# Patient Record
Sex: Male | Born: 1975 | Race: White | Hispanic: No | Marital: Single | Smoking: Current every day smoker
Health system: Southern US, Community
[De-identification: ages and names within clinical notes are randomized; demographics above are authoritative.]

## PROBLEM LIST (undated history)

## (undated) HISTORY — PX: FACIAL RECONSTRUCTION SURGERY: SHX631

## (undated) HISTORY — PX: HAND SURGERY: SHX662

---

## 1999-09-21 ENCOUNTER — Emergency Department (HOSPITAL_COMMUNITY): Admission: EM | Admit: 1999-09-21 | Discharge: 1999-09-21 | Payer: Self-pay | Admitting: Internal Medicine

## 2009-08-18 ENCOUNTER — Emergency Department (HOSPITAL_COMMUNITY): Admission: EM | Admit: 2009-08-18 | Discharge: 2009-08-18 | Payer: Self-pay | Admitting: Emergency Medicine

## 2012-12-21 ENCOUNTER — Encounter (HOSPITAL_COMMUNITY): Payer: Self-pay | Admitting: Emergency Medicine

## 2012-12-21 ENCOUNTER — Emergency Department (HOSPITAL_COMMUNITY)
Admission: EM | Admit: 2012-12-21 | Discharge: 2012-12-21 | Disposition: A | Discharge status: Home / Self Care | Payer: Self-pay | Attending: Emergency Medicine | Admitting: Emergency Medicine

## 2012-12-21 DIAGNOSIS — K089 Disorder of teeth and supporting structures, unspecified: Secondary | ICD-10-CM | POA: Insufficient documentation

## 2012-12-21 DIAGNOSIS — R6883 Chills (without fever): Secondary | ICD-10-CM | POA: Insufficient documentation

## 2012-12-21 DIAGNOSIS — R609 Edema, unspecified: Secondary | ICD-10-CM | POA: Insufficient documentation

## 2012-12-21 DIAGNOSIS — Z792 Long term (current) use of antibiotics: Secondary | ICD-10-CM | POA: Insufficient documentation

## 2012-12-21 DIAGNOSIS — K0889 Other specified disorders of teeth and supporting structures: Secondary | ICD-10-CM

## 2012-12-21 DIAGNOSIS — F17200 Nicotine dependence, unspecified, uncomplicated: Secondary | ICD-10-CM | POA: Insufficient documentation

## 2012-12-21 MED ORDER — CLINDAMYCIN HCL 150 MG PO CAPS: 300.0000 mg | ORAL_CAPSULE | Freq: Three times a day (TID) | ORAL | Status: DC

## 2012-12-21 MED ORDER — HYDROCODONE-ACETAMINOPHEN 5-325 MG PO TABS: 2.0000 | ORAL_TABLET | Freq: Four times a day (QID) | ORAL | Status: DC | PRN

## 2012-12-21 NOTE — ED Provider Notes (Signed)
History  This chart was scribed for Russell Castro - PA by Manuela Schwartz, ED scribe. This patient was seen in room TR06C/TR06C and the patient's care was started at 1559.  CSN: [id number] Arrival date & time 12/21/12  1544  First MD Initiated Contact with Patient 12/21/12 1559     Chief Complaint  Patient presents with  . Dental Pain   The history is provided by the patient. No language interpreter was used.   HPI Comments: Russell Castro is a 37 y.o. male who presents to the Emergency Department w/hx of dental carries complaining of constant, gradually worsening left upper dental pain onset 2 days ago which he attributes to a dental abscess which he noticed worsened 2 days ago. He states past couple weeks he noticed it but past couple days tried taking a clindamycin he found at home but only took a few doses and did not feel any improvements. He also states tried taking tylenol and ASA w/out improvements in swelling or pain. He states associated symptoms of chills/swelling/pain and difficulty chewing food.    History reviewed. No pertinent past medical history. Past Surgical History  Procedure Laterality Date  . Facial reconstruction surgery    . Hand surgery     History reviewed. No pertinent family history. History  Substance Use Topics  . Smoking status: Current Every Day Smoker    Types: Cigarettes  . Smokeless tobacco: Not on file  . Alcohol Use: Yes    Review of Systems  Constitutional: Positive for chills. Negative for fever.  HENT: Positive for dental problem (left upper dental pain/swelling). Negative for congestion and rhinorrhea.   Respiratory: Negative for cough and shortness of breath.   Cardiovascular: Negative for chest pain.  Gastrointestinal: Negative for nausea, vomiting and abdominal pain.  Musculoskeletal: Negative for back pain.  Skin: Negative for color change and pallor.  Neurological: Negative for weakness.  All other systems reviewed and are  negative.   A complete 10 system review of systems was obtained and all systems are negative except as noted in the HPI and PMH.   Allergies  Sulfa antibiotics  Home Medications   Current Outpatient Rx  Name  Route  Sig  Dispense  Refill  . acetaminophen (TYLENOL) 500 MG tablet   Oral   Take 500 mg by mouth every 6 (six) hours as needed for pain.         . CEPHALEXIN PO   Oral   Take 1 tablet by mouth daily.         Marland Kitchen CLINDAMYCIN HCL PO   Oral   Take 2 tablets by mouth 3 (three) times daily.         Marland Kitchen ibuprofen (ADVIL,MOTRIN) 200 MG tablet   Oral   Take 200 mg by mouth every 6 (six) hours as needed for pain.          Triage vitals: BP 116/71  Pulse 74  Temp(Src) 97.6 F (36.4 C) (Oral)  Resp 16  Ht 6\' 4"  (1.93 m)  Wt 140 lb (63.504 kg)  BMI 17.05 kg/m2  SpO2 99% Physical Exam  Nursing note and vitals reviewed. Constitutional: He is oriented to person, place, and time. He appears well-developed and well-nourished. No distress.  HENT:  Head: Normocephalic and atraumatic.  Mouth/Throat:    Poor dentition throughout.  Affected tooth as diagrammed.  No signs of peritonsillar or tonsillar abscess.  No signs of gingival abscess. Oropharynx is clear and without exudates.  Uvula is midline.  Airway is intact. No signs of Ludwig's angina.   Eyes: EOM are normal.  Neck: Neck supple. No tracheal deviation present.  Cardiovascular: Normal rate.   Pulmonary/Chest: Effort normal. No respiratory distress.  Musculoskeletal: Normal range of motion.  Neurological: He is alert and oriented to person, place, and time.  Skin: Skin is warm and dry.  Psychiatric: He has a normal mood and affect. His behavior is normal.    ED Course  Procedures (including critical care time) DIAGNOSTIC STUDIES: Oxygen Saturation is 99% on room air, normal by my interpretation.    COORDINATION OF CARE: At 408 PM Discussed treatment plan with patient which includes antibiotics, pain  medicine. Patient agrees.   Labs Reviewed - No data to display No results found. 1. Pain, dental     MDM  Uncomplicated dental pain.  Abx, pain meds, dental referral.  VSS. Afebrile.    I personally performed the services described in this documentation, which was scribed in my presence. The recorded information has been reviewed and is accurate.   Russell Horseman, PA-C 12/21/12 1618  Russell Horseman, PA-C 12/21/12 [phone]

## 2012-12-21 NOTE — ED Provider Notes (Signed)
Medical screening examination/treatment/procedure(s) were performed by non-physician practitioner and as supervising physician I was immediately available for consultation/collaboration.  Doug Sou, MD 12/21/12 [phone]

## 2012-12-21 NOTE — ED Notes (Addendum)
Pt presents to ED today with complaints of possible mouth swelling and pain since yesterday at 9am. Left side of face noted to be swollen in triage.

## 2013-06-16 ENCOUNTER — Emergency Department (HOSPITAL_COMMUNITY): Payer: No Typology Code available for payment source

## 2013-06-16 ENCOUNTER — Emergency Department (HOSPITAL_COMMUNITY)
Admission: EM | Admit: 2013-06-16 | Discharge: 2013-06-16 | Disposition: A | Discharge status: Home / Self Care | Payer: No Typology Code available for payment source | Attending: Emergency Medicine | Admitting: Emergency Medicine

## 2013-06-16 ENCOUNTER — Encounter (HOSPITAL_COMMUNITY): Payer: Self-pay | Admitting: Emergency Medicine

## 2013-06-16 DIAGNOSIS — S0083XA Contusion of other part of head, initial encounter: Secondary | ICD-10-CM

## 2013-06-16 DIAGNOSIS — Z792 Long term (current) use of antibiotics: Secondary | ICD-10-CM | POA: Insufficient documentation

## 2013-06-16 DIAGNOSIS — S298XXA Other specified injuries of thorax, initial encounter: Secondary | ICD-10-CM | POA: Insufficient documentation

## 2013-06-16 DIAGNOSIS — Y9389 Activity, other specified: Secondary | ICD-10-CM | POA: Insufficient documentation

## 2013-06-16 DIAGNOSIS — Y92410 Unspecified street and highway as the place of occurrence of the external cause: Secondary | ICD-10-CM | POA: Insufficient documentation

## 2013-06-16 DIAGNOSIS — S0003XA Contusion of scalp, initial encounter: Secondary | ICD-10-CM | POA: Insufficient documentation

## 2013-06-16 DIAGNOSIS — F17200 Nicotine dependence, unspecified, uncomplicated: Secondary | ICD-10-CM | POA: Insufficient documentation

## 2013-06-16 DIAGNOSIS — R0789 Other chest pain: Secondary | ICD-10-CM

## 2013-06-16 DIAGNOSIS — S1093XA Contusion of unspecified part of neck, initial encounter: Secondary | ICD-10-CM

## 2013-06-16 DIAGNOSIS — IMO0002 Reserved for concepts with insufficient information to code with codable children: Secondary | ICD-10-CM | POA: Insufficient documentation

## 2013-06-16 DIAGNOSIS — S060X1A Concussion with loss of consciousness of 30 minutes or less, initial encounter: Secondary | ICD-10-CM

## 2013-06-16 MED ORDER — ACETAMINOPHEN 325 MG PO TABS
650.0000 mg | ORAL_TABLET | Freq: Once | ORAL | Status: AC
  Administered 2013-06-16: 650 mg via ORAL
  Filled 2013-06-16: qty 2
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
## 2013-06-16 NOTE — Discharge Instructions (Signed)
You have had a head injury which does not appear to require admission at this time. A concussion is a state of changed mental ability from trauma. SEEK IMMEDIATE MEDICAL ATTENTION IF: There is confusion or drowsiness (although children frequently become drowsy after injury).  You cannot awaken the injured person.  There is nausea (feeling sick to your stomach) or continued, forceful vomiting.  You notice dizziness or unsteadiness which is getting worse, or inability to walk.  You have convulsions or unconsciousness.  You experience severe, persistent headaches not relieved by Tylenol?. (Do not take aspirin as this impairs clotting abilities). Take other pain medications only as directed.  You cannot use arms or legs normally.  There are changes in pupil sizes. (This is the black center in the colored part of the eye)  There is clear or bloody discharge from the nose or ears.  Change in speech, vision, swallowing, or understanding.  Localized weakness, numbness, tingling, or change in bowel or bladder control. You have been diagnosed by your caregiver as having chest wall pain. SEEK IMMEDIATE MEDICAL ATTENTION IF: You develop a fever.  Your chest pains become severe or intolerable.  You develop new, unexplained symptoms (problems).  You develop shortness of breath, nausea, vomiting, sweating or feel light headed.  You develop a new cough or you cough up blood.  Emergency Department Resource Guide 1) Find a Doctor and Pay Out of Pocket Although you won't have to find out who is covered by your insurance plan, it is a good idea to ask around and get recommendations. You will then need to call the office and see if the doctor you have chosen will accept you as a new patient and what types of options they offer for patients who are self-pay. Some doctors offer discounts or will set up payment plans for their patients who do not have insurance, but you will need to ask so you aren't surprised when you  get to your appointment.  2) Contact Your Local Health Department Not all health departments have doctors that can see patients for sick visits, but many do, so it is worth a call to see if yours does. If you don't know where your local health department is, you can check in your phone book. The CDC also has a tool to help you locate your state's health department, and many state websites also have listings of all of their local health departments.  3) Find a Walk-in Clinic If your illness is not likely to be very severe or complicated, you may want to try a walk in clinic. These are popping up all over the country in pharmacies, drugstores, and shopping centers. They're usually staffed by nurse practitioners or physician assistants that have been trained to treat common illnesses and complaints. They're usually fairly quick and inexpensive. However, if you have serious medical issues or chronic medical problems, these are probably not your best option.  No Primary Care Doctor: - Call Health Connect at  [phone] - they can help you locate a primary care doctor that  accepts your insurance, provides certain services, etc. - Physician Referral Service- [phone]  Chronic Pain Problems: Organization         Address  Phone   Notes  [facility]  [phone] Patients need to be referred by their primary care doctor.   Medication Assistance: Organization         Address  Phone   Notes  [facility] Medication Assistance Program  [address] [place], [place] [address] [phone]) [phone] --Must be a resident of [facility] [place] -- Must have NO insurance coverage whatsoever (no Medicaid/ Medicare, etc.) -- The pt. MUST have a primary care doctor that directs their care regularly and follows them in the community   MedAssist  [phone]) [phone]   [employer]  [phone]    Agencies that provide inexpensive medical care: Organization         Address  Phone    Notes  [facility]  [phone]   [facility]    [phone]   [facility] [address] [place], [place] [address] [phone]) [phone]   [facility] [address], [place] [phone]) [phone]   [facility]anned Parenthood    [phone]) [phone]   [facility]    [phone]) [phone]   [facility]  [address], [place] Phone:  [phone]) [phone], Fax:  [phone] Hours of Operation:  9 am - 6 pm, M-F.  Also accepts Medicaid/Medicare and self-pay.  [facility]  [address], [place] Phone: [phone](336) [phone], Fax: [phone]. Hours of Operation:  8:30 am - 5:30 pm, M-F.  Also accepts Medicaid and self-pay.  [facility] [place] [address], [place] Phone: [phone]) [phone]   [facility] [address], [place], [place] [phone], Ext. [phone] Mondays & Thursdays: 7-9 AM.  First 15 patients are seen on a first come, first serve basis.    Medicaid-accepting [facility] Providers:  Organization         Address  Phone   Notes  [facility] [address], [place] [phone] Also accepts self-pay patients.  [facility] [address], [place]  [phone]) [phone]   [facility] [address], [place] [phone]) [phone]   [facility] [address] [place] [address], [place] [phone]   Renaye RakersVeita Bland [address], [place]   [phone]) [phone] Only accepts [place] Access [place] patients after they have their name applied to their card.   Self-Pay (no insurance) in [facility] [place]:  Organization         Address  Phone   Notes  Sickle Cell Patients, [facility] [address], [place] [phone]   [facility] [address], [place] [phone]) [phone]   [facility] [place]  [address],  [phone]) [phone]   [facility]/Dr. Osei-Bonsu  [address] [place] [address], [place] or [phone], [place] [phone]) [phone] Phone number for both [place] and [place] locations is the same.  [facility] [address], [place] [phone]   [facility] [place] [address] [place] [address], [place] or [address] [phone](336) [phone]) [phone]   [facility] [address], [place] [phone]) [phone], phone; [phone]) [phone], fax Sees patients 1st and 3rd Saturday of every month.  Must not qualify for public or private insurance (i.e. Medicaid, Medicare, [place] Health Choice, Veterans' Benefits)  Household income should be no more than 200% of the poverty level The clinic cannot treat you if you are pregnant or think you are pregnant  Sexually transmitted diseases are not treated at the clinic.  Dental Care: Organization         Address  Phone  Notes  [facility] [address] [place], [place] [phone] Accepts children up to age 58 who are enrolled in [place] or [place] Health Choice; pregnant women with a Medicaid card; and children who have applied for Medicaid or [place] Health Choice, but were declined, whose parents can pay a reduced fee at time of service.  [facility] of [facility]  [address], [place] [phone] Accepts children up to age 30 who are enrolled in [place] or [place] Health Choice; pregnant women with a Medicaid card; and children who have applied for Medicaid or [place] Health Choice, but were declined, whose parents can pay a reduced fee at time of service.  [facility]  [address] [place], [place] [phone] Patients are seen by appointment only. Walk-ins are not accepted. [facility] will see patients 62 years of age and  older. Monday - Tuesday (8am-5pm) Most Wednesdays (8:30-5pm) $30 per visit, cash only  [facility] Access PRO[facility]  [address], [facility] [phone] Patients are seen by appointment only. Walk-ins are not accepted. [facility] will see patients 35 years of age and older. One Wednesday Evening (Monthly: Volunteer Based).  $30 per visit, cash only  [employer]  [phone] for adults; Children under age 53, call Graduate Pediatric Dentistry at [phone]. Children aged 40-14, please call [phone] to request a pediatric application.  Dental services are provided in all areas of dental care including fillings, crowns and bridges, complete and partial dentures, implants, gum treatment, root canals, and extractions. Preventive care is also provided. Treatment is provided to both adults and children. Patients are selected via a lottery and there is often a waiting list.   [facility] [address] [place]  [phone] [email or web address]   [facility] [address] [place], [place] [phone], Ext. [phone] Second and Fourth Thursday of each month, opens at 6:30 AM; Clinic ends at 9 AM.  Patients are seen on a first-come first-served basis, and a limited number are seen during each clinic.   [facility]  [address] Ether [place], [place] [phone]   Eligibility Requirements You must have lived in [place], [place], or [place] counties for at least the last three months.   You cannot be eligible for state or federal sponsored [place], including [employer], [place], or [employer].   You generally cannot be eligible for healthcare insurance through your employer.    How to apply: Eligibility screenings are held every Tuesday and Wednesday afternoon from 1:00 pm until 4:00 pm. You do not need an appointment for the interview!  [facility] [address],  [place], [place] [phone]   [facility] Health Department  [phone]   [facility] Health Department  [phone]   [facility] Health Department  [phone]    Behavioral Health Resources in the Community: Intensive Outpatient Programs Organization         Address  Phone  Notes  [facility] [address] [place], [place] [phone]   [facility]patient [address] [place], [place] [phone]   [facility] [address], [place], [place]  [phone]   [facility] [address],  [place], [place] [phone] or [phone]   Substance Abuse Resources Organization         Address  Phone  Notes  [facility] Drug Services  [phone]   [facility]  [phone]   [facility] [place]  [phone]   Floydene Flock  [phone]   Residential & Outpatient Substance Abuse Program  [phone]   Psychological Services Organization         Address  Phone  Notes  [facility]  [phone]   [facility]  [phone]   [facility] [address], [place] [phone] or [phone]    Mobile Crisis Teams Organization         Address  Phone  Notes  [facility], Mobile Crisis Care Unit  [phone]   [facility]  [address] [place], [place] [phone]   Doristine Locks [address] [place] [phone]    Self-Help/Support Groups Organization         Address  Phone             Notes  Mental Health Assoc. of  - variety of support groups  336- I7437963 Call for more information  Narcotics Anonymous (NA), [facility] [address], [employer] [place]  2 meetings at this location   Statistician         Address  Phone  Notes  [facility] [address],    [place]  [phone]   [facility]  [address] [place] [address], [place], [place] [phone]   [facility] [address] [place], [place] [phone] Admissions: 8am-3pm M-F  [facility] [address],    [place], [place] [phone]   [facility] [address] [place], [place], [place] [phone]   [facility] [address]  [place], [place] [phone]   [facility] - Intensive Outpatient [address], [place], [place] [phone]   [facility] ([facility] [address]  [place], [place] [phone] or [phone]   [facility] ([facility]) [address], [place], [place] [phone] Accepts Medicaid  [facility] [address],  [place] [phone] Substance Abuse/Addiction Treatment   [facility] Organization         Address  Phone  Notes  [facility]  [phone]   Angie Fava, PhD [address] [place], [place]   [phone] or [phone]   [facility] Behavioral   [address] [place], [place] [phone]   [facility] [address], [place], [place] [phone] Insurance/Medicaid/sponsorship through [facility] [address]                                    [place], [place] [phone] Therapy/tele-psych/case  [facility] [address], [place] [phone]    Dr. Lolly Mustache  [phone]   [facility]  [employer] [facility] Dept. 1) [address] [place] 2) [address], [place] 3)  [address], [place] [phone]  [phone]   [facility] Child Abuse Hotline (602)384-4078 or [phone] (After Hours)

## 2013-06-16 NOTE — ED Notes (Addendum)
While in CT, pt became anxious, trying to take c-collar off. Was spinning it around on his neck. This RN went over to CT and reassured him everything was okay and educated him on the importance of keeping the c-collar in place. Pt verbalized understanding and compliance. Is asking for something for a headache.

## 2013-06-16 NOTE — ED Notes (Signed)
[employer] officer at bedside talking with patient.
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
## 2015-01-13 ENCOUNTER — Emergency Department
Admission: EM | Admit: 2015-01-13 | Discharge: 2015-01-13 | Disposition: A | Discharge status: Home / Self Care | Payer: Self-pay | Attending: Emergency Medicine | Admitting: Emergency Medicine

## 2015-01-13 ENCOUNTER — Emergency Department: Payer: Self-pay

## 2015-01-13 DIAGNOSIS — Z720 Tobacco use: Secondary | ICD-10-CM | POA: Insufficient documentation

## 2015-01-13 DIAGNOSIS — S0093XA Contusion of unspecified part of head, initial encounter: Secondary | ICD-10-CM

## 2015-01-13 DIAGNOSIS — W06XXXA Fall from bed, initial encounter: Secondary | ICD-10-CM | POA: Insufficient documentation

## 2015-01-13 DIAGNOSIS — S0083XA Contusion of other part of head, initial encounter: Secondary | ICD-10-CM | POA: Insufficient documentation

## 2015-01-13 DIAGNOSIS — Y998 Other external cause status: Secondary | ICD-10-CM | POA: Insufficient documentation

## 2015-01-13 DIAGNOSIS — Y9389 Activity, other specified: Secondary | ICD-10-CM | POA: Insufficient documentation

## 2015-01-13 DIAGNOSIS — Y9289 Other specified places as the place of occurrence of the external cause: Secondary | ICD-10-CM | POA: Insufficient documentation

## 2015-01-13 DIAGNOSIS — S199XXA Unspecified injury of neck, initial encounter: Secondary | ICD-10-CM | POA: Insufficient documentation

## 2015-01-13 MED ORDER — IBUPROFEN 800 MG PO TABS: 800.0000 mg | ORAL_TABLET | Freq: Three times a day (TID) | ORAL | Status: DC | PRN

## 2015-01-13 MED ORDER — KETOROLAC TROMETHAMINE 60 MG/2ML IM SOLN
60.0000 mg | Freq: Once | INTRAMUSCULAR | Status: AC
  Administered 2015-01-13: 60 mg via INTRAMUSCULAR
  Filled 2015-01-13: qty 2

## 2015-01-13 MED ORDER — CYCLOBENZAPRINE HCL 10 MG PO TABS: 10.0000 mg | ORAL_TABLET | Freq: Three times a day (TID) | ORAL | Status: DC | PRN

## 2015-01-13 MED ORDER — HYDROCODONE-ACETAMINOPHEN 5-325 MG PO TABS: 1.0000 | ORAL_TABLET | ORAL | Status: DC | PRN

## 2015-01-13 MED ORDER — DIAZEPAM 5 MG/ML IJ SOLN
5.0000 mg | Freq: Once | INTRAMUSCULAR | Status: AC
  Administered 2015-01-13: 5 mg via INTRAMUSCULAR
  Filled 2015-01-13: qty 2

## 2015-01-13 NOTE — ED Notes (Signed)
Pt states he rolled out of bed today and is having neck and back pain

## 2015-01-13 NOTE — ED Provider Notes (Signed)
[facility] Emergency Department Provider Note  ____________________________________________  Time seen: Approximately 5:54 PM  I have reviewed the triage vital signs and the nursing notes.   HISTORY  Chief Complaint Fall   HPI JP EASTHAM is a 39 y.o. male reports falling out of bed at about 3:00 this morning. She states that is about 4 feet tall his head on a dresser causing an abrasion with loss of consciousness for about a minute to 2 minutes.States that his neck is been hurting all day along with body aches. Denies any nausea or vomiting.   History reviewed. No pertinent past medical history.  There are no active problems to display for this patient.   Past Surgical History  Procedure Laterality Date  . Facial reconstruction surgery    . Hand surgery      Current Outpatient Rx  Name  Route  Sig  Dispense  Refill  . cyclobenzaprine (FLEXERIL) 10 MG tablet   Oral   Take 1 tablet (10 mg total) by mouth every 8 (eight) hours as needed for muscle spasms.   30 tablet   1   . HYDROcodone-acetaminophen (NORCO) 5-325 MG per tablet   Oral   Take 1-2 tablets by mouth every 4 (four) hours as needed for moderate pain.   8 tablet   0   . ibuprofen (ADVIL,MOTRIN) 800 MG tablet   Oral   Take 1 tablet (800 mg total) by mouth every 8 (eight) hours as needed.   30 tablet   0     Allergies Sulfa antibiotics  No family history on file.  Social History History  Substance Use Topics  . Smoking status: Current Every Day Smoker    Types: Cigarettes  . Smokeless tobacco: Not on file  . Alcohol Use: Yes    Review of Systems Constitutional: No fever/chills Eyes: No visual changes. ENT: No sore throat. Cardiovascular: Denies chest pain. Respiratory: Denies shortness of breath. Gastrointestinal: No abdominal pain.  No nausea, no vomiting.  No diarrhea.  No constipation. Genitourinary: Negative for dysuria. Musculoskeletal: Positive for neck  pain for body aches. Skin: Negative for rash. Neurological: Negative for headaches, focal weakness or numbness.  10-point ROS otherwise negative.  ____________________________________________   PHYSICAL EXAM:  VITAL SIGNS: ED Triage Vitals  Enc Vitals Group     BP 01/13/15 1734 129/82 mmHg     Pulse Rate 01/13/15 1734 70     Resp 01/13/15 1734 16     Temp 01/13/15 1734 98 F (36.7 C)     Temp Source 01/13/15 1734 Oral     SpO2 01/13/15 1734 98 %     Weight 01/13/15 1734 160 lb (72.576 kg)     Height 01/13/15 1734  (1.93 m)     Head Cir --      Peak Flow --      Pain Score 01/13/15 1734 6     Pain Loc --      Pain Edu? --      Excl. in GC? --     Constitutional: Alert and oriented. Well appearing and in no acute distress. Eyes: Conjunctivae are normal. PERRL. EOMI. Head: Atraumatic. Nose: No congestion/rhinnorhea. Mouth/Throat: Mucous membranes are moist.  Oropharynx non-erythematous. Neck: No stridor.  Cervical tenderness. Limited range of motion with flexion, extension and lateralization Cardiovascular: Normal rate, regular rhythm. Grossly normal heart sounds.  Good peripheral circulation. Respiratory: Normal respiratory effort.  No retractions. Lungs CTAB. Musculoskeletal: No lower extremity tenderness nor edema.  No joint effusions. Neurologic:  Normal speech and language. No gross focal neurologic deficits are appreciated. No gait instability. Skin:  Skin is warm, dry and intact. No rash noted. Psychiatric: Mood and affect are normal. Speech and behavior are normal.  ____________________________________________   LABS (all labs ordered are listed, but only abnormal results are displayed)  Labs Reviewed - No data to display ____________________________________________   RADIOLOGY  Head CT and spinal CT both negative by radiologist left orbital screws intact from previous  surgery. ____________________________________________   PROCEDURES  Procedure(s) performed: None  Critical Care performed: No  ____________________________________________   INITIAL IMPRESSION / ASSESSMENT AND PLAN / ED COURSE  Pertinent labs & imaging results that were available during my care of the patient were reviewed by me and considered in my medical decision making (see chart for details).  Acute facial and head contusion. Rx given for Motrin and Flexeril. Patient follow-up with PCP or return to the ER with any worsening or new developing symptomology. Patient voices no other emergency medical complaints at this visit. ____________________________________________   FINAL CLINICAL IMPRESSION(S) / ED DIAGNOSES  Final diagnoses:  Facial contusion, initial encounter  Head contusion, initial encounter      Evangeline Dakin, PA-C 01/13/15 1940  Darien Ramus, MD 01/13/15 2328

## 2015-01-13 NOTE — Discharge Instructions (Signed)
Head Injury  You have received a head injury. It does not appear serious at this time. Headaches and vomiting are common following head injury. It should be easy to awaken from sleeping. Sometimes it is necessary for you to stay in the emergency department for a while for observation. Sometimes admission to the hospital may be needed. After injuries such as yours, most problems occur within the first 24 hours, but side effects may occur up to 7-10 days after the injury. It is important for you to carefully monitor your condition and contact your health care provider or seek immediate medical care if there is a change in your condition.  WHAT ARE THE TYPES OF HEAD INJURIES?  Head injuries can be as minor as a bump. Some head injuries can be more severe. More severe head injuries include:   A jarring injury to the brain (concussion).   A bruise of the brain (contusion). This mean there is bleeding in the brain that can cause swelling.   A cracked skull (skull fracture).   Bleeding in the brain that collects, clots, and forms a bump (hematoma).  WHAT CAUSES A HEAD INJURY?  A serious head injury is most likely to happen to someone who is in a car wreck and is not wearing a seat belt. Other causes of major head injuries include bicycle or motorcycle accidents, sports injuries, and falls.  HOW ARE HEAD INJURIES DIAGNOSED?  A complete history of the event leading to the injury and your current symptoms will be helpful in diagnosing head injuries. Many times, pictures of the brain, such as CT or MRI are needed to see the extent of the injury. Often, an overnight hospital stay is necessary for observation.   WHEN SHOULD I SEEK IMMEDIATE MEDICAL CARE?   You should get help right away if:   You have confusion or drowsiness.   You feel sick to your stomach (nauseous) or have continued, forceful vomiting.   You have dizziness or unsteadiness that is getting worse.   You have severe, continued headaches not relieved by  medicine. Only take over-the-counter or prescription medicines for pain, fever, or discomfort as directed by your health care provider.   You do not have normal function of the arms or legs or are unable to walk.   You notice changes in the black spots in the center of the colored part of your eye (pupil).   You have a clear or bloody fluid coming from your nose or ears.   You have a loss of vision.  During the next 24 hours after the injury, you must stay with someone who can watch you for the warning signs. This person should contact local emergency services (911 in the [place] if you have seizures, you become unconscious, or you are unable to wake up.  HOW CAN I PREVENT A HEAD INJURY IN THE FUTURE?  The most important factor for preventing major head injuries is avoiding motor vehicle accidents. To minimize the potential for damage to your head, it is crucial to wear seat belts while riding in motor vehicles. Wearing helmets while bike riding and playing collision sports (like football) is also helpful. Also, avoiding dangerous activities around the house will further help reduce your risk of head injury.   WHEN CAN I RETURN TO NORMAL ACTIVITIES AND ATHLETICS?  You should be reevaluated by your health care provider before returning to these activities. If you have any of the following symptoms, you should not return to activities   or contact sports until 1 week after the symptoms have stopped:   Persistent headache.   Dizziness or vertigo.   Poor attention and concentration.   Confusion.   Memory problems.   Nausea or vomiting.   Fatigue or tire easily.   Irritability.   Intolerant of bright lights or loud noises.   Anxiety or depression.   Disturbed sleep.  MAKE SURE YOU:    Understand these instructions.   Will watch your condition.   Will get help right away if you are not doing well or get worse.  Document Released: 06/02/2005 Document Revised: 06/07/2013 Document Reviewed:  02/07/2013  Exit[employer] Patient Information 2015 [employer]. This information is not intended to replace advice given to you by your health care provider. Make sure you discuss any questions you have with your health care provider.          Facial or Scalp Contusion  A facial or scalp contusion is a deep bruise on the face or head. Injuries to the face and head generally cause a lot of swelling, especially around the eyes. Contusions are the result of an injury that caused bleeding under the skin. The contusion may turn blue, purple, or yellow. Minor injuries will give you a painless contusion, but more severe contusions may stay painful and swollen for a few weeks.   CAUSES   A facial or scalp contusion is caused by a blunt injury or trauma to the face or head area.   SIGNS AND SYMPTOMS    Swelling of the injured area.    Discoloration of the injured area.    Tenderness, soreness, or pain in the injured area.   DIAGNOSIS   The diagnosis can be made by taking a medical history and doing a physical exam. An X-ray exam, CT scan, or MRI may be needed to determine if there are any associated injuries, such as broken bones (fractures).  TREATMENT   Often, the best treatment for a facial or scalp contusion is applying cold compresses to the injured area. Over-the-counter medicines may also be recommended for pain control.   HOME CARE INSTRUCTIONS    Only take over-the-counter or prescription medicines as directed by your health care provider.    Apply ice to the injured area.    Put ice in a plastic bag.    Place a towel between your skin and the bag.    Leave the ice on for 20 minutes, 2-3 times a day.   SEEK MEDICAL CARE IF:   You have bite problems.    You have pain with chewing.    You are concerned about facial defects.  SEEK IMMEDIATE MEDICAL CARE IF:   You have severe pain or a headache that is not relieved by medicine.    You have unusual sleepiness, confusion, or personality changes.     You throw up (vomit).    You have a persistent nosebleed.    You have double vision or blurred vision.    You have fluid drainage from your nose or ear.    You have difficulty walking or using your arms or legs.   MAKE SURE YOU:    Understand these instructions.   Will watch your condition.   Will get help right away if you are not doing well or get worse.  Document Released: 07/10/2004 Document Revised: 03/23/2013 Document Reviewed: 01/13/2013  Exit[employer] Patient Information 2015 [employer]. This information is not intended to replace advice given to you by your   health care provider. Make sure you discuss any questions you have with your health care provider.

## 2020-03-12 ENCOUNTER — Telehealth: Payer: Self-pay | Admitting: Nurse Practitioner

## 2020-03-12 NOTE — Telephone Encounter (Signed)
Called to Discuss with patient about Covid symptoms and the use of the SQ monoclonal antibody injection for those who have been exposed to Covid and at a high risk of hospitalization.     Pt appears to qualify for this injection due to co-morbid conditions and/or a member of an at-risk group in accordance with the FDA Emergency Use Authorization.    Unable to reach pt   

## 2020-06-05 ENCOUNTER — Emergency Department (HOSPITAL_COMMUNITY)
Admission: EM | Admit: 2020-06-05 | Discharge: 2020-06-06 | Disposition: A | Discharge status: Home / Self Care | Payer: Self-pay | Attending: Emergency Medicine | Admitting: Emergency Medicine

## 2020-06-05 ENCOUNTER — Encounter (HOSPITAL_COMMUNITY): Payer: Self-pay

## 2020-06-05 ENCOUNTER — Other Ambulatory Visit: Payer: Self-pay

## 2020-06-05 DIAGNOSIS — Z5321 Procedure and treatment not carried out due to patient leaving prior to being seen by health care provider: Secondary | ICD-10-CM | POA: Insufficient documentation

## 2020-06-05 DIAGNOSIS — R531 Weakness: Secondary | ICD-10-CM | POA: Insufficient documentation

## 2020-06-05 DIAGNOSIS — R519 Headache, unspecified: Secondary | ICD-10-CM | POA: Insufficient documentation

## 2020-06-05 NOTE — ED Triage Notes (Signed)
Pt has complaint of headaches x1wk, states that they are so bad he cannot walk, feels like a nerve is bothering him from the top of his head down the size of his face. Hx of reconstructive sx of L orbital socket, concerned about potential issue with screws/plates.

## 2020-06-05 NOTE — ED Notes (Signed)
No answer for VS x3 

## 2020-06-18 ENCOUNTER — Ambulatory Visit (INDEPENDENT_AMBULATORY_CARE_PROVIDER_SITE_OTHER): Payer: Self-pay

## 2020-06-18 ENCOUNTER — Encounter: Payer: Self-pay | Admitting: Medical-Surgical

## 2020-06-18 ENCOUNTER — Other Ambulatory Visit: Payer: Self-pay

## 2020-06-18 ENCOUNTER — Ambulatory Visit (INDEPENDENT_AMBULATORY_CARE_PROVIDER_SITE_OTHER): Payer: Self-pay | Admitting: Medical-Surgical

## 2020-06-18 VITALS — BP 132/83 | HR 87 | Temp 98.2°F | Ht 74.61 in | Wt 142.7 lb

## 2020-06-18 DIAGNOSIS — G4452 New daily persistent headache (NDPH): Secondary | ICD-10-CM

## 2020-06-18 DIAGNOSIS — Z7689 Persons encountering health services in other specified circumstances: Secondary | ICD-10-CM

## 2020-06-18 MED ORDER — SUMATRIPTAN SUCCINATE 50 MG PO TABS: 50.0000 mg | ORAL_TABLET | ORAL | 0 refills | Status: DC | PRN

## 2020-06-18 MED ORDER — HYDROXYZINE HCL 10 MG PO TABS: 10.0000 mg | ORAL_TABLET | Freq: Three times a day (TID) | ORAL | 1 refills | Status: DC | PRN

## 2020-06-18 NOTE — Progress Notes (Signed)
New Patient Office Visit  Subjective:  Patient ID: Russell Castro, male    DOB: 1975-12-26  Age: 45 y.o. MRN: [id number]  CC:  Chief Complaint  Patient presents with  . Headache    HPI [name] presents to establish care.  He has not had a primary care physician in quite a few years and has not sought regular preventative health care.  Over the last 7 years, he has had issues with headaches but recently, he has begun having new onset severe headaches occurring as often as several times a day but reliably every other day.  He has been evaluated at the emergency room and notes these were considered cluster headaches. When headaches get severe, feels that he does not want to interact so he "shuts down". Wife notes that once, when taken to the ED, he was unable to walk in and had to be helped into a wheelchair to get him into the building. She reports he seems "catatonic" during severe headaches.   HEADACHE   Onset: sudden Location: left side of head from base of skull to left eye  Quality: sharp, stabbing, constant, not pulsating Frequency: at least every other day, sometimes 2-3 times daily Precipitating factors: neck pain/stiffness, anxiety  Prior treatment: Imitrex- helpful, goes to sleep; Hydroxyzine- helpful, reduces anxiety associated with symptoms and prevents full blown headache  Associated Symptoms Nausea/vomiting: yes  Photophobia/phonophobia: yes  Tearing of eyes: yes  Sinus pain/pressure: yes, copious nasal discharge immediately post headache  Family hx migraine: yes  Personal stressors: yes  Relation to menstrual cycle: no   Red Flags Fever: no  Neck pain/stiffness: yes Vision/speech/swallow/hearing difficulty: no  Focal weakness/numbness: no  Altered mental status: no  Trauma: yes, history of falls with head impact New type of headache: yes  Anticoagulant use: no  H/o cancer/HIV/Pregnancy: no   History reviewed. No pertinent past medical history.  Past  Surgical History:  Procedure Laterality Date  . FACIAL RECONSTRUCTION SURGERY    . HAND SURGERY      No family history on file.  Social History   Socioeconomic History  . Marital status: Single    Spouse name: Not on file  . Number of children: Not on file  . Years of education: Not on file  . Highest education level: Not on file  Occupational History  . Not on file  Tobacco Use  . Smoking status: Current Every Day Smoker    Packs/day: 1.50    Types: Cigarettes  . Smokeless tobacco: Never Used  Vaping Use  . Vaping Use: Never used  Substance and Sexual Activity  . Alcohol use: Yes    Alcohol/week: 1.0 - 3.0 standard drink    Types: 1 - 3 Standard drinks or equivalent per week  . Drug use: Yes    Types: Marijuana  . Sexual activity: Yes    Partners: Female  Other Topics Concern  . Not on file  Social History Narrative  . Not on file   Social Determinants of Health   Financial Resource Strain: Not on file  Food Insecurity: Not on file  Transportation Needs: Not on file  Physical Activity: Not on file  Stress: Not on file  Social Connections: Not on file  Intimate Partner Violence: Not on file    ROS Review of Systems  Constitutional: Positive for unexpected weight change (Cannot gain weight). Negative for chills and fever.  HENT: Positive for rhinorrhea.   Eyes: Positive for photophobia. Negative for visual disturbance.  Notes intermittent eye twitching with severe headaches  Respiratory: Positive for chest tightness and shortness of breath. Negative for cough and wheezing.   Cardiovascular: Negative for chest pain, palpitations and leg swelling.  Gastrointestinal: Positive for nausea.  Endocrine: Positive for cold intolerance.  Musculoskeletal: Positive for neck pain and neck stiffness.  Neurological: Positive for dizziness, tremors and headaches.  Psychiatric/Behavioral: Positive for sleep disturbance (occasionally has headache episodes that awaken  him from sleep). The patient is nervous/anxious.     Objective:   Today's Vitals: BP 132/83 (BP Location: Left Arm, Patient Position: Sitting, Cuff Size: Small)   Pulse 87   Temp 98.2 F (36.8 C) (Oral)   Ht 6' 2.61" (1.895 m)   Wt 142 lb 11.2 oz (64.7 kg)   SpO2 99%   BMI 18.03 kg/m   Physical Exam Vitals and nursing note reviewed.  Constitutional:      General: He is not in acute distress.    Appearance: Normal appearance.     Comments: Underweight   HENT:     Head: Normocephalic and atraumatic.  Cardiovascular:     Rate and Rhythm: Normal rate and regular rhythm.     Pulses: Normal pulses.     Heart sounds: Normal heart sounds. No murmur heard. No friction rub. No gallop.   Pulmonary:     Effort: Pulmonary effort is normal. No respiratory distress.     Breath sounds: Normal breath sounds.  Musculoskeletal:     Right hand: Normal strength.     Left hand: Normal strength.  Skin:    General: Skin is warm and dry.  Neurological:     Mental Status: He is alert and oriented to person, place, and time.     GCS: GCS eye subscore is 4. GCS verbal subscore is 5. GCS motor subscore is 6.     Motor: Tremor present.     Gait: Gait is intact.     Comments: Resting tremor noted affecting head/neck and bilateral hands.   Psychiatric:        Mood and Affect: Mood normal.        Behavior: Behavior normal.        Thought Content: Thought content normal.        Judgment: Judgment normal.     Assessment & Plan:   1. Encounter to establish care Discussed available information and health care concerns.   2. New daily persistent headache Checking CBC w/diff, CMP, and TSH. CT head w/o contrast to start. Referring to Neurology for further evaluation. Continue Imitrex as needed, advised to take no more than 2 doses in a day. Hydroxyzine 10mg  TID prn. Advised to take this regularly for a few days to see if this helps control anxiety and reduced headache severity/frequency.  -  Ambulatory referral to Neurology - CBC With Differential - TSH - CT HEAD WO CONTRAST; Future - Comprehensive Metabolic Panel (CMET)   Outpatient Encounter Medications as of 06/18/2020  Medication Sig  . hydrOXYzine (ATARAX/VISTARIL) 10 MG tablet Take 1 tablet (10 mg total) by mouth 3 (three) times daily as needed.  . SUMAtriptan (IMITREX) 50 MG tablet Take 1 tablet (50 mg total) by mouth every 2 (two) hours as needed for migraine. May repeat in 2 hours if headache persists or recurs.  08/16/2020 acetaminophen (TYLENOL) 325 MG tablet Take by mouth.  . [DISCONTINUED] cyclobenzaprine (FLEXERIL) 10 MG tablet Take 1 tablet (10 mg total) by mouth every 8 (eight) hours as needed for muscle spasms.  . [  DISCONTINUED] HYDROcodone-acetaminophen (NORCO) 5-325 MG per tablet Take 1-2 tablets by mouth every 4 (four) hours as needed for moderate pain.  . [DISCONTINUED] ibuprofen (ADVIL,MOTRIN) 800 MG tablet Take 1 tablet (800 mg total) by mouth every 8 (eight) hours as needed.   No facility-administered encounter medications on file as of 06/18/2020.    Follow-up: Follow up pending lab/imaging results.   Thayer Ohm, DNP, APRN, FNP-BC [facility] and [facility] Medicine

## 2020-06-19 ENCOUNTER — Encounter: Payer: Self-pay | Admitting: Medical-Surgical

## 2020-06-19 LAB — CBC WITH DIFFERENTIAL
Basophils Absolute: 0.1 10*3/uL (ref 0.0–0.2)
Basos: 1 %
EOS (ABSOLUTE): 0.4 10*3/uL (ref 0.0–0.4)
Eos: 3 %
Hematocrit: 47.4 % (ref 37.5–51.0)
Hemoglobin: 15.8 g/dL (ref 13.0–17.7)
Immature Grans (Abs): 0 10*3/uL (ref 0.0–0.1)
Immature Granulocytes: 0 %
Lymphocytes Absolute: 3.2 10*3/uL — ABNORMAL HIGH (ref 0.7–3.1)
Lymphs: 31 %
MCH: 29.8 pg (ref 26.6–33.0)
MCHC: 33.3 g/dL (ref 31.5–35.7)
MCV: 89 fL (ref 79–97)
Monocytes Absolute: 0.8 10*3/uL (ref 0.1–0.9)
Monocytes: 7 %
Neutrophils Absolute: 6 10*3/uL (ref 1.4–7.0)
Neutrophils: 58 %
RBC: 5.3 x10E6/uL (ref 4.14–5.80)
RDW: 12 % (ref 11.6–15.4)
WBC: 10.5 10*3/uL (ref 3.4–10.8)

## 2020-06-19 LAB — COMPREHENSIVE METABOLIC PANEL
ALT: 21 IU/L (ref 0–44)
AST: 24 IU/L (ref 0–40)
Albumin/Globulin Ratio: 1.9 (ref 1.2–2.2)
Albumin: 5.2 g/dL — ABNORMAL HIGH (ref 4.0–5.0)
Alkaline Phosphatase: 101 IU/L (ref 44–121)
BUN/Creatinine Ratio: 9 (ref 9–20)
BUN: 10 mg/dL (ref 6–24)
Bilirubin Total: 0.3 mg/dL (ref 0.0–1.2)
CO2: 23 mmol/L (ref 20–29)
Calcium: 10 mg/dL (ref 8.7–10.2)
Chloride: 101 mmol/L (ref 96–106)
Creatinine, Ser: 1.12 mg/dL (ref 0.76–1.27)
GFR calc Af Amer: 93 mL/min/{1.73_m2} (ref >59)
GFR calc non Af Amer: 81 mL/min/{1.73_m2} (ref >59)
Globulin, Total: 2.7 g/dL (ref 1.5–4.5)
Glucose: 81 mg/dL (ref 65–99)
Potassium: 4.7 mmol/L (ref 3.5–5.2)
Sodium: 140 mmol/L (ref 134–144)
Total Protein: 7.9 g/dL (ref 6.0–8.5)

## 2020-06-19 LAB — TSH: TSH: 2.46 u[IU]/mL (ref 0.450–4.500)

## 2020-06-20 MED ORDER — SUMATRIPTAN 20 MG/ACT NA SOLN: 20.0000 mg | NASAL | 0 refills | Status: DC | PRN

## 2020-06-22 MED ORDER — AMITRIPTYLINE HCL 25 MG PO TABS: 25.0000 mg | ORAL_TABLET | Freq: Every day | ORAL | 0 refills | Status: DC

## 2020-06-22 MED ORDER — HYDROXYZINE HCL 25 MG PO TABS: 25.0000 mg | ORAL_TABLET | Freq: Three times a day (TID) | ORAL | 0 refills | Status: DC | PRN

## 2020-06-22 NOTE — Addendum Note (Signed)
Addended byChristen Butter on: 06/22/2020 10:34 AM   Modules accepted: Orders

## 2020-06-23 ENCOUNTER — Other Ambulatory Visit: Payer: Self-pay | Admitting: Medical-Surgical

## 2020-06-25 ENCOUNTER — Encounter: Payer: Self-pay | Admitting: Medical-Surgical

## 2020-06-25 ENCOUNTER — Other Ambulatory Visit: Payer: Self-pay | Admitting: Medical-Surgical

## 2020-06-26 ENCOUNTER — Other Ambulatory Visit: Payer: Self-pay | Admitting: Medical-Surgical

## 2020-06-26 MED ORDER — TOPIRAMATE 25 MG PO TABS: ORAL_TABLET | ORAL | 0 refills | Status: AC

## 2020-06-26 NOTE — Progress Notes (Signed)
Sumatriptan nasal spray and tablets not effective for headache relief and now bordering on overuse. Discontinue sumatriptan. Continue Amitriptyline as prescribed and add in Topamax. Encouraged follow up with neurology as discussed. Reassured that it does take time for the preventative medications to provide good relief.  Thayer Ohm, DNP, APRN, FNP-BC [facility] and [facility] Medicine

## 2020-06-27 ENCOUNTER — Encounter: Payer: Self-pay | Admitting: Medical-Surgical

## 2020-06-27 ENCOUNTER — Other Ambulatory Visit: Payer: Self-pay

## 2020-06-27 ENCOUNTER — Ambulatory Visit: Payer: Self-pay | Admitting: Family Medicine

## 2020-06-27 ENCOUNTER — Ambulatory Visit (INDEPENDENT_AMBULATORY_CARE_PROVIDER_SITE_OTHER): Payer: Self-pay | Admitting: Medical-Surgical

## 2020-06-27 ENCOUNTER — Ambulatory Visit (INDEPENDENT_AMBULATORY_CARE_PROVIDER_SITE_OTHER): Payer: Self-pay

## 2020-06-27 VITALS — BP 107/71 | HR 86 | Temp 98.5°F | Ht 74.61 in | Wt 145.2 lb

## 2020-06-27 DIAGNOSIS — R29898 Other symptoms and signs involving the musculoskeletal system: Secondary | ICD-10-CM

## 2020-06-27 DIAGNOSIS — G4452 New daily persistent headache (NDPH): Secondary | ICD-10-CM

## 2020-06-27 DIAGNOSIS — M542 Cervicalgia: Secondary | ICD-10-CM

## 2020-06-27 MED ORDER — BACLOFEN 10 MG PO TABS: 5.0000 mg | ORAL_TABLET | Freq: Three times a day (TID) | ORAL | 0 refills | Status: DC

## 2020-06-27 NOTE — Progress Notes (Signed)
Subjective:    CC: persistent headaches  HPI: Pleasant 45 year old male accompanied by his girlfriend presenting for further evaluation of his persistent headaches.  Due to having severe left-sided head pain that is preceded by neck stiffness.  Notes that the neck stiffness and discomfort can occur as much as an hour before his head starts to hurt.  Notes the pain extends up along the backside of his head and around into his face.  We started him on hydroxyzine which seemed to help some.  He also tried sumatriptan which was somewhat helpful but did not remain so.  Has an effort at prevention, we started amitriptyline at 25 mg nightly.  This did not seem to be having any effect and a MyChart message was sent asking for a refill on the sumatriptan.  Given that he received both pills and nasal spray, it was decided to initiate 25 mg Topamax daily for 1 week then increase to twice daily.  He started that medication yesterday.  Today, he notes that he was able to sleep through the night without waking up in severe pain.  He feels better today than he has in several days but is not sure what may be causing the improvement.  He does not normally have neck pain but he does play a bass guitar and admits to having horrible posture.  Notes that at times he has muscle spasms and twitches in his neck, face, and calves.  Today, these do not seem to be as bad in very minimally visible during the appointment.  Has an appointment set up with neurology in about 1.5 weeks.  I reviewed the past medical history, family history, social history, surgical history, and allergies today and no changes were needed.  Please see the problem list section below in epic for further details.  Past Medical History: History reviewed. No pertinent past medical history. Past Surgical History: Past Surgical History:  Procedure Laterality Date  . FACIAL RECONSTRUCTION SURGERY    . HAND SURGERY     Social History: Social History    Socioeconomic History  . Marital status: Single    Spouse name: Not on file  . Number of children: Not on file  . Years of education: Not on file  . Highest education level: Not on file  Occupational History  . Not on file  Tobacco Use  . Smoking status: Current Every Day Smoker    Packs/day: 1.50    Types: Cigarettes  . Smokeless tobacco: Never Used  Vaping Use  . Vaping Use: Never used  Substance and Sexual Activity  . Alcohol use: Yes    Alcohol/week: 1.0 - 3.0 standard drink    Types: 1 - 3 Standard drinks or equivalent per week  . Drug use: Yes    Types: Marijuana  . Sexual activity: Yes    Partners: Female  Other Topics Concern  . Not on file  Social History Narrative  . Not on file   Social Determinants of Health   Financial Resource Strain: Not on file  Food Insecurity: Not on file  Transportation Needs: Not on file  Physical Activity: Not on file  Stress: Not on file  Social Connections: Not on file   Family History: History reviewed. No pertinent family history. Allergies: Allergies  Allergen Reactions  . Sulfa Antibiotics     unknown   Medications: See med rec.  Review of Systems: See HPI for pertinent positives and negatives.   Objective:    General: Well Developed,  well nourished, and in no acute distress.  Neuro: Alert and oriented x3.  HEENT: Normocephalic, atraumatic.  Skin: Warm and dry. Cardiac: Regular rate and rhythm, no murmurs rubs or gallops, no lower extremity edema.  Respiratory: Clear to auscultation bilaterally. Not using accessory muscles, speaking in full sentences. Neck: Full range of motion to cervical spine.  Tenderness to palpation along the left SCM from mid neck up to just under the ear.  Negative Spurling's.  Impression and Recommendations:    1. New daily persistent headache/Neck tightness Evaluating for cervical etiology to headaches.  Getting cervical spine x-rays today.  Given that he has neck tightness/tension  prior to his headaches, trialing baclofen as a abortive agent.  Strongly recommend follow-up with neurology. - DG Cervical Spine Complete; Future  Return if symptoms worsen or fail to improve. ___________________________________________ Thayer Ohm, DNP, APRN, FNP-BC Primary Care and Sports Medicine [facility] [place]

## 2020-06-29 ENCOUNTER — Other Ambulatory Visit: Payer: Self-pay | Admitting: Medical-Surgical

## 2020-07-02 MED ORDER — SUMATRIPTAN SUCCINATE 50 MG PO TABS: 50.0000 mg | ORAL_TABLET | ORAL | 0 refills | Status: DC | PRN

## 2020-07-03 ENCOUNTER — Encounter: Payer: Self-pay | Admitting: Medical-Surgical

## 2020-07-13 ENCOUNTER — Other Ambulatory Visit: Payer: Self-pay | Admitting: Medical-Surgical

## 2020-07-19 ENCOUNTER — Other Ambulatory Visit: Payer: Self-pay | Admitting: Medical-Surgical

## 2020-07-26 MED ORDER — HYDROXYZINE HCL 25 MG PO TABS: 25.0000 mg | ORAL_TABLET | Freq: Three times a day (TID) | ORAL | 0 refills | Status: DC | PRN

## 2020-08-21 ENCOUNTER — Other Ambulatory Visit: Payer: Self-pay | Admitting: Medical-Surgical

## 2020-08-22 MED ORDER — HYDROXYZINE HCL 25 MG PO TABS: 25.0000 mg | ORAL_TABLET | Freq: Three times a day (TID) | ORAL | 5 refills | Status: DC | PRN

## 2020-09-12 ENCOUNTER — Other Ambulatory Visit: Payer: Self-pay | Admitting: Medical-Surgical

## 2020-09-26 NOTE — Progress Notes (Signed)
Subjective:    CC: discuss mental health issues  HPI: Russell 45 year old Castro presenting today to discuss mental health concerns. Has a long standing history of anxiety but notes that it has been severe lately. At previous appointments, we discussed his severe recurrent headaches since that was a more pressing issue. Has been seeing Neurology regarding his headaches. Reports they took him off the medications that had been started and gave him other medications to try. None of those have been helpful. He asked them about gabapentin but they did not want to prescribe that due to the risk for potential abuse of the medication. Admits his relative gave him some gabapentin and he has been taking 300mg  TID. This has helped his headaches and he is only getting 1 every 4-5 days now instead of several times daily.  Now that his headaches are under control, his anxiety is worse than ever. He has difficulty sitting still for more than a few minutes. He's constantly restless and finds himself pacing most of the day. Reports he has worn a walking path in the grass on his lawn. He doesn't have a specific trigger for his symptoms and notes that he hasn't found anything that helps. Used to smoke marijuana which was helpful but that doesn't help anymore and he has quit the habit. Has difficulty concentrating to finish all but the most basic task. Still finds pleasure in doing things but is unable to complete things and gets frustrated. No SI/HI. Was taking Hydroxyzine 25mg  TID but ran out and did not get the refill yet because of finances. When he ran out, he decided to restart the Amitriptyline and Topamax that he was originally prescribed for headaches. He took these meds again for the first time last night.   I reviewed the past medical history, family history, social history, surgical history, and allergies today and no changes were needed.  Please see the problem list section below in epic for further  details.  Past Medical History: History reviewed. No pertinent past medical history. Past Surgical History: Past Surgical History:  Procedure Laterality Date  . FACIAL RECONSTRUCTION SURGERY    . HAND SURGERY     Social History: Social History   Socioeconomic History  . Marital status: Single    Spouse name: Not on file  . Number of children: Not on file  . Years of education: Not on file  . Highest education level: Not on file  Occupational History  . Not on file  Tobacco Use  . Smoking status: Current Every Day Smoker    Packs/day: 1.50    Types: Cigarettes  . Smokeless tobacco: Never Used  Vaping Use  . Vaping Use: Never used  Substance and Sexual Activity  . Alcohol use: Yes    Alcohol/week: 1.0 - 3.0 standard drink    Types: 1 - 3 Standard drinks or equivalent per week  . Drug use: Yes    Types: Marijuana  . Sexual activity: Yes    Partners: Female  Other Topics Concern  . Not on file  Social History Narrative  . Not on file   Social Determinants of Health   Financial Resource Strain: Not on file  Food Insecurity: Not on file  Transportation Needs: Not on file  Physical Activity: Not on file  Stress: Not on file  Social Connections: Not on file   Family History: History reviewed. No pertinent family history. Allergies: Allergies  Allergen Reactions  . Sulfa Antibiotics Nausea And Vomiting   Medications:  See med rec.  Review of Systems: See HPI for pertinent positives and negatives.   Depression screen [facility] 2/9 09/27/2020 06/18/2020  Decreased Interest 0 0  Down, Depressed, Hopeless 2 1  PHQ - 2 Score 2 1  Altered sleeping 0 0  Tired, decreased energy 1 0  Change in appetite 1 0  Feeling bad or failure about yourself  3 0  Trouble concentrating 3 0  Moving slowly or fidgety/restless 3 0  Suicidal thoughts 0 0  PHQ-9 Score 13 1  Difficult doing work/chores Extremely dIfficult Not difficult at all   GAD 7 : Generalized Anxiety Score 09/27/2020  06/18/2020  Nervous, Anxious, on Edge 3 2  Control/stop worrying 3 1  Worry too much - different things 3 1  Trouble relaxing 3 1  Restless 3 2  Easily annoyed or irritable 3 1  Afraid - awful might happen 0 2  Total GAD 7 Score 18 10  Anxiety Difficulty Extremely difficult Very difficult     Objective:    General: Well Developed, well nourished. Restless, agitated.  Neuro: Alert and oriented x3.  HEENT: Normocephalic, atraumatic.  Skin: Warm and dry. Cardiac: Regular rate and rhythm, no murmurs rubs or gallops, no lower extremity edema.  Respiratory: Clear to auscultation bilaterally. Not using accessory muscles, speaking in full sentences.   Impression and Recommendations:    1. Anxiety 2. Moderate episode of recurrent major depressive disorder (HCC) Compulsive pacing and racing thoughts consistent with manic type behavior. Continue nightly Amitriptyline 25mg . Start Sertraline at 25mg  daily for 1 week then increase to 50mg  daily. Discussed effectiveness timeline for these medications and that it will likely take several weeks before it starts to make a difference. Resume Hydroxyzine as prescribed.   Return in about 3 weeks (around 10/18/2020) for mood follow up. ___________________________________________ , DNP, APRN, FNP-BC Primary Care and Sports Medicine [facility] [place]

## 2020-09-27 ENCOUNTER — Encounter: Payer: Self-pay | Admitting: Medical-Surgical

## 2020-09-27 ENCOUNTER — Other Ambulatory Visit: Payer: Self-pay

## 2020-09-27 ENCOUNTER — Ambulatory Visit (INDEPENDENT_AMBULATORY_CARE_PROVIDER_SITE_OTHER): Payer: Self-pay | Admitting: Medical-Surgical

## 2020-09-27 VITALS — BP 105/70 | HR 84 | Temp 98.5°F | Ht 74.61 in | Wt 145.0 lb

## 2020-09-27 DIAGNOSIS — F331 Major depressive disorder, recurrent, moderate: Secondary | ICD-10-CM

## 2020-09-27 DIAGNOSIS — F419 Anxiety disorder, unspecified: Secondary | ICD-10-CM

## 2020-09-27 MED ORDER — HYDROXYZINE HCL 25 MG PO TABS: 25.0000 mg | ORAL_TABLET | Freq: Three times a day (TID) | ORAL | 3 refills | Status: AC | PRN

## 2020-09-27 MED ORDER — SERTRALINE HCL 50 MG PO TABS: 50.0000 mg | ORAL_TABLET | Freq: Every day | ORAL | 3 refills | Status: AC

## 2020-10-18 ENCOUNTER — Ambulatory Visit: Payer: Medicaid Other | Admitting: Medical-Surgical

## 2020-10-19 ENCOUNTER — Ambulatory Visit: Payer: Medicaid Other | Admitting: Medical-Surgical

## 2020-10-19 DIAGNOSIS — F419 Anxiety disorder, unspecified: Secondary | ICD-10-CM

## 2020-10-19 DIAGNOSIS — F331 Major depressive disorder, recurrent, moderate: Secondary | ICD-10-CM

## 2021-01-07 ENCOUNTER — Encounter: Payer: Self-pay | Admitting: Medical-Surgical

## 2021-04-04 ENCOUNTER — Ambulatory Visit (INDEPENDENT_AMBULATORY_CARE_PROVIDER_SITE_OTHER): Payer: Self-pay | Admitting: Medical-Surgical

## 2021-04-04 DIAGNOSIS — Z91199 Patient's noncompliance with other medical treatment and regimen due to unspecified reason: Secondary | ICD-10-CM

## 2021-04-04 NOTE — Progress Notes (Signed)
   Complete physical exam  Patient: Russell Castro   DOB: 04/05/1997   45 y.o. Male  MRN: [id number]  Subjective:    No chief complaint on file.   Russell Castro is a 45 y.o. male who presents today for a complete physical exam. She reports consuming a {diet types:17450} diet. {types:19826} She generally feels {DESC; WELL/FAIRLY WELL/POORLY:18703}. She reports sleeping {DESC; WELL/FAIRLY WELL/POORLY:18703}. She {does/does not:200015} have additional problems to discuss today.    Most recent fall risk assessment:    12/11/2021   10:42 AM  Fall Risk   Falls in the past year? 0  Number falls in past yr: 0  Injury with Fall? 0  Risk for fall due to : No Fall Risks  Follow up Falls evaluation completed     Most recent depression screenings:    12/11/2021   10:42 AM 11/01/2020   10:46 AM  PHQ 2/9 Scores  PHQ - 2 Score 0 0  PHQ- 9 Score 5     {VISON DENTAL STD PSA (Optional):27386}  {History (Optional):23778}  Patient Care Team: Soliana Kitko, NP as PCP - General (Nurse Practitioner)   Outpatient Medications Prior to Visit  Medication Sig   fluticasone (FLONASE) 50 MCG/ACT nasal spray Place 2 sprays into both nostrils in the morning and at bedtime. After 7 days, reduce to once daily.   norgestimate-ethinyl estradiol (SPRINTEC 28) 0.25-35 MG-MCG tablet Take 1 tablet by mouth daily.   Nystatin POWD Apply liberally to affected area 2 times per day   spironolactone (ALDACTONE) 100 MG tablet Take 1 tablet (100 mg total) by mouth daily.   No facility-administered medications prior to visit.    ROS        Objective:     There were no vitals taken for this visit. {Vitals History (Optional):23777}  Physical Exam   No results found for any visits on 01/16/22. {Show previous labs (optional):23779}    Assessment & Plan:    Routine Health Maintenance and Physical Exam  Immunization History  Administered Date(s) Administered   DTaP 06/19/1999, 08/15/1999,  10/24/1999, 07/09/2000, 01/23/2004   Hepatitis A 11/19/2007, 11/24/2008   Hepatitis B 04/06/1999, 05/14/1999, 10/24/1999   HiB (PRP-OMP) 06/19/1999, 08/15/1999, 10/24/1999, 07/09/2000   IPV 06/19/1999, 08/15/1999, 04/13/2000, 01/23/2004   Influenza,inj,Quad PF,6+ Mos 02/24/2014   Influenza-Unspecified 05/26/2012   MMR 04/13/2001, 01/23/2004   Meningococcal Polysaccharide 11/24/2011   Pneumococcal Conjugate-13 07/09/2000   Pneumococcal-Unspecified 10/24/1999, 01/07/2000   Tdap 11/24/2011   Varicella 04/13/2000, 11/19/2007    Health Maintenance  Topic Date Due   HIV Screening  Never done   Hepatitis C Screening  Never done   INFLUENZA VACCINE  01/14/2022   PAP-Cervical Cytology Screening  01/16/2022 (Originally 04/04/2020)   PAP SMEAR-Modifier  01/16/2022 (Originally 04/04/2020)   TETANUS/TDAP  01/16/2022 (Originally 11/23/2021)   HPV VACCINES  Discontinued   COVID-19 Vaccine  Discontinued    Discussed health benefits of physical activity, and encouraged her to engage in regular exercise appropriate for her age and condition.  Problem List Items Addressed This Visit   None Visit Diagnoses     Annual physical exam    -  Primary   Cervical cancer screening       Need for Tdap vaccination          No follow-ups on file.     Reann Dobias, NP   

## 2021-06-05 IMAGING — DX DG CERVICAL SPINE COMPLETE 4+V
6 series · 6 of 6 positions shown · non-contrast
Comparison: None.

CLINICAL DATA: Left neck pain and stiffness for 1 month. Headaches.

EXAM:
CERVICAL SPINE - COMPLETE 4+ VIEW

[c-spine lat]
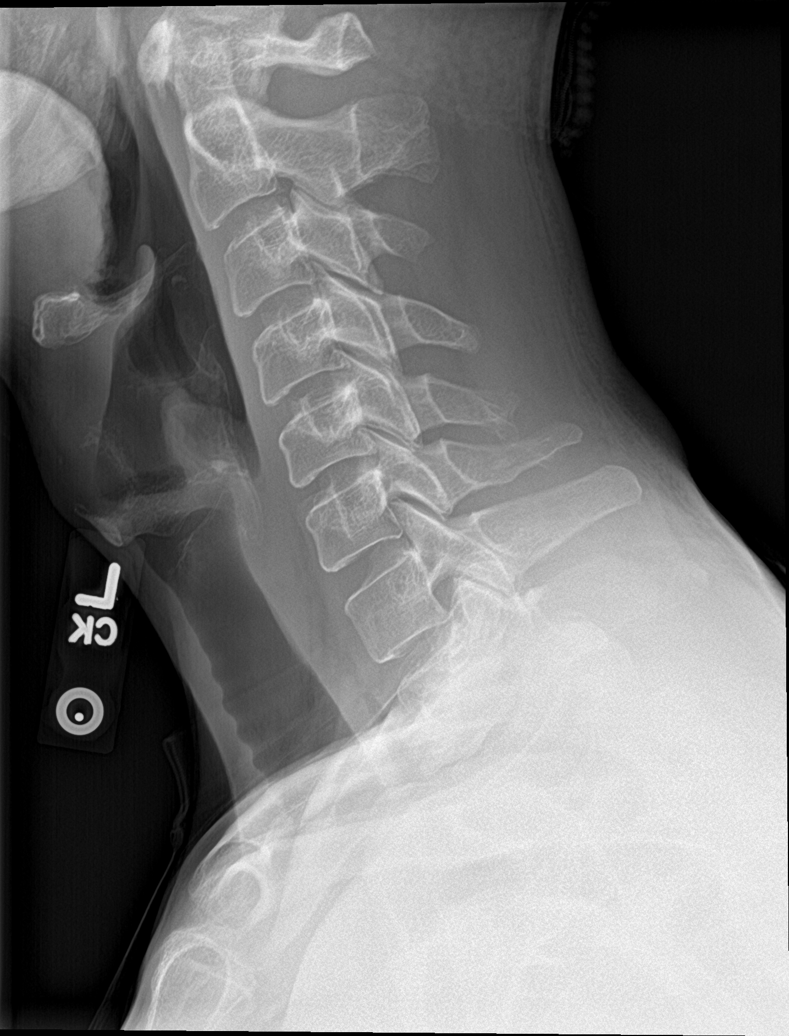

[c-spine obl (1 of 2)]
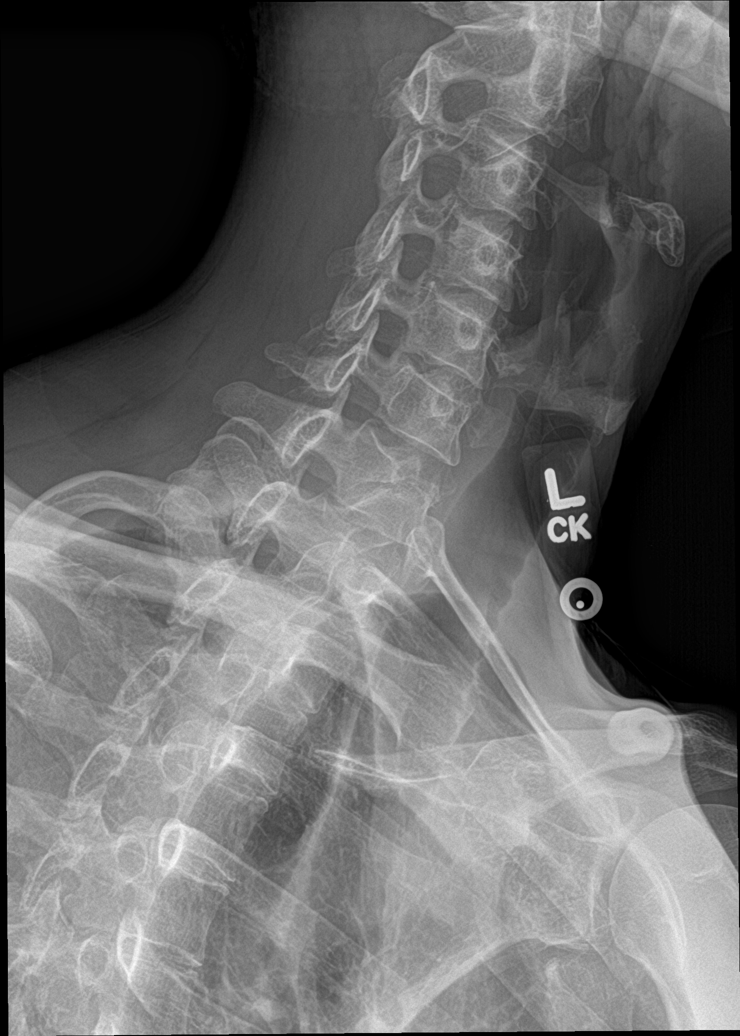

[c-spine obl (2 of 2)]
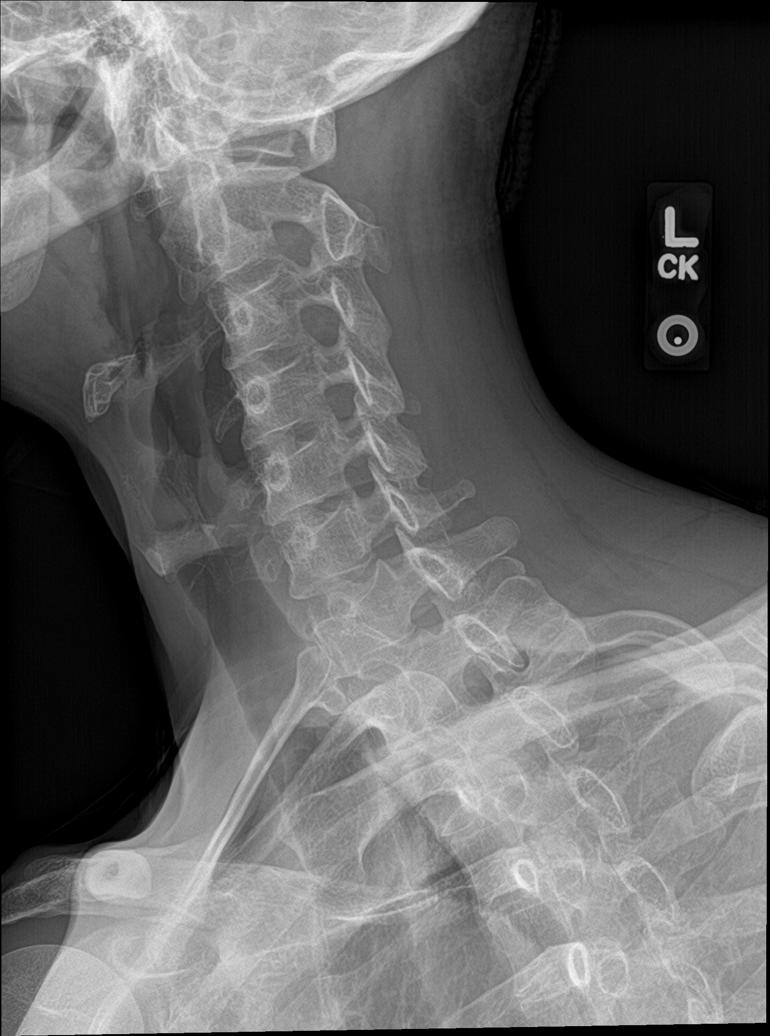

[c-spine ap]
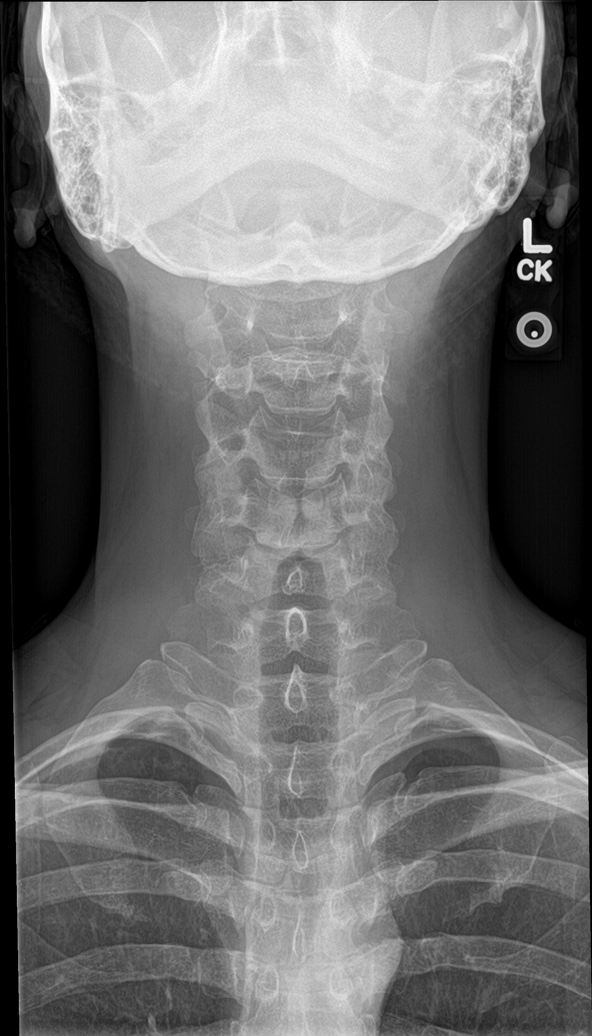

[c-spine open mouth]
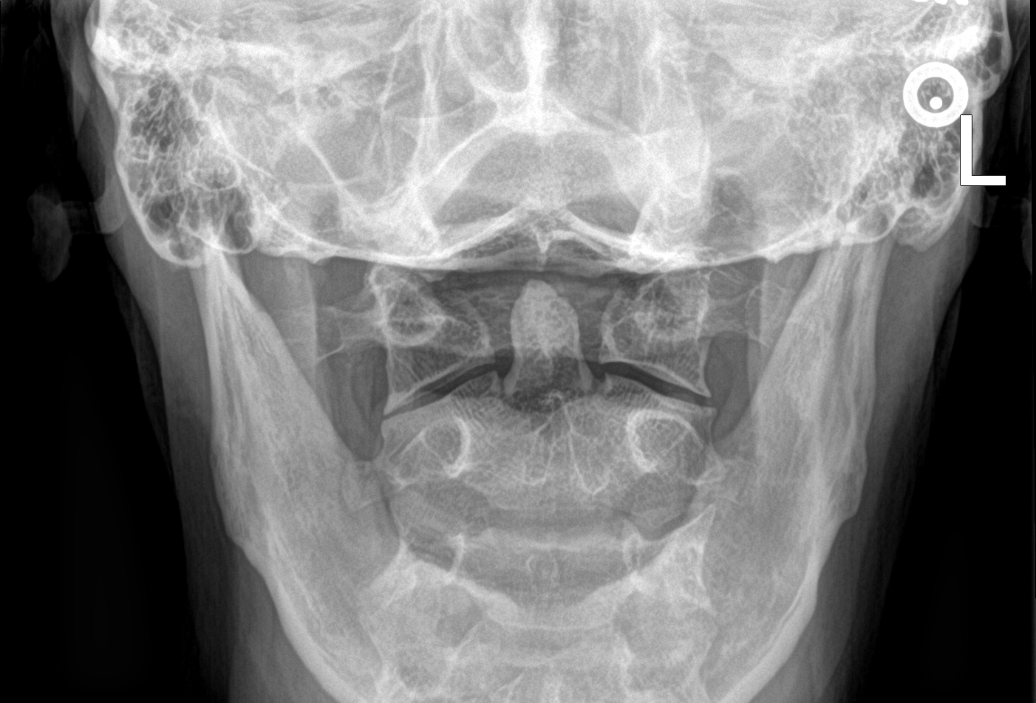

[c-spine swimmers]
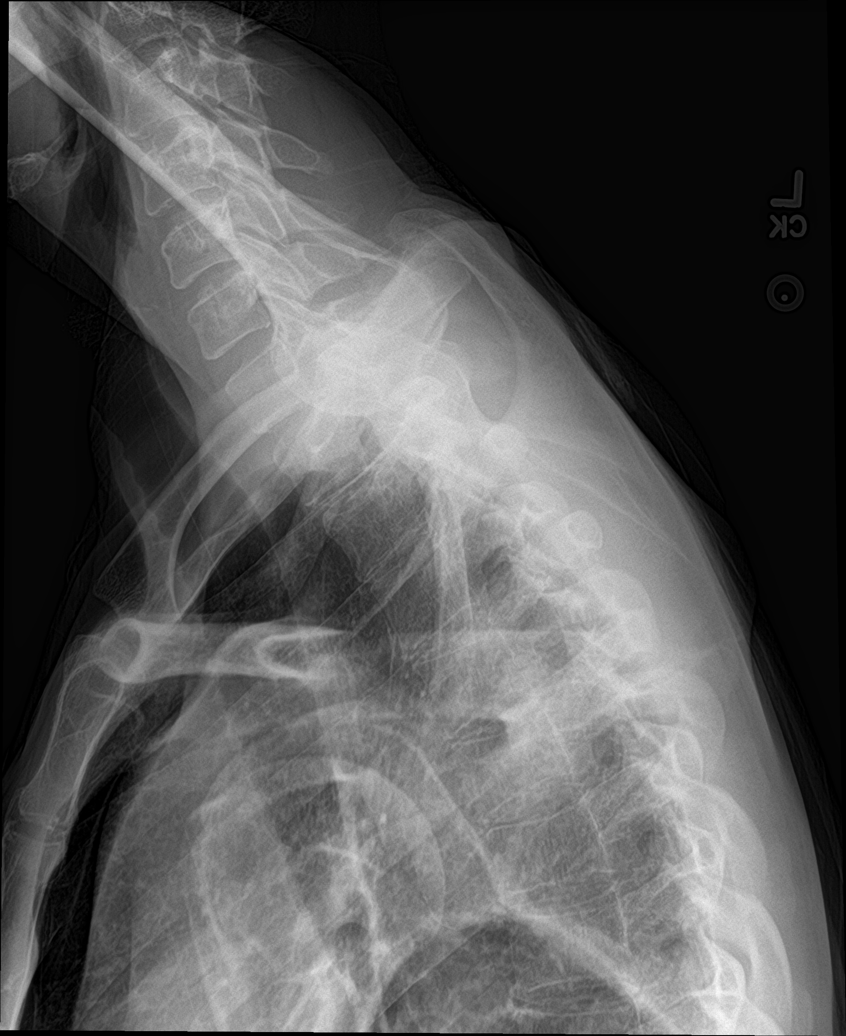

[6 of 6 positions shown; findings below may reference images not displayed]

FINDINGS: There is no evidence of cervical spine fracture or prevertebral soft
tissue swelling. Alignment is normal. No other significant bone
abnormalities are identified.
IMPRESSION: Negative cervical spine radiographs.

## 2024-04-13 LAB — COLOGUARD: COLOGUARD: NEGATIVE
